# Patient Record
Sex: Female | Born: 1965 | Race: White | Hispanic: No | Marital: Married | Smoking: Former smoker
Health system: Southern US, Community
[De-identification: ages and names within clinical notes are randomized; demographics above are authoritative.]

## PROBLEM LIST (undated history)

## (undated) DIAGNOSIS — Z789 Other specified health status: Secondary | ICD-10-CM

## (undated) HISTORY — PX: WISDOM TOOTH EXTRACTION: SHX21

## (undated) HISTORY — PX: NO PAST SURGERIES: SHX2092

---

## 1998-03-05 ENCOUNTER — Other Ambulatory Visit: Admission: RE | Admit: 1998-03-05 | Discharge: 1998-03-05 | Payer: Self-pay | Admitting: Obstetrics and Gynecology

## 1999-09-12 ENCOUNTER — Other Ambulatory Visit: Admission: RE | Admit: 1999-09-12 | Discharge: 1999-09-12 | Payer: Self-pay | Admitting: *Deleted

## 1999-10-06 ENCOUNTER — Encounter: Admission: RE | Admit: 1999-10-06 | Discharge: 1999-10-06 | Payer: Self-pay | Admitting: General Surgery

## 1999-10-06 ENCOUNTER — Encounter: Payer: Self-pay | Admitting: General Surgery

## 2000-09-24 ENCOUNTER — Encounter (INDEPENDENT_AMBULATORY_CARE_PROVIDER_SITE_OTHER): Payer: Self-pay | Admitting: Specialist

## 2000-09-24 ENCOUNTER — Ambulatory Visit (HOSPITAL_COMMUNITY): Admission: RE | Admit: 2000-09-24 | Discharge: 2000-09-24 | Payer: Self-pay | Admitting: Gastroenterology

## 2001-11-24 ENCOUNTER — Encounter: Admission: RE | Admit: 2001-11-24 | Discharge: 2001-11-24 | Payer: Self-pay | Admitting: Gastroenterology

## 2001-11-24 ENCOUNTER — Encounter: Payer: Self-pay | Admitting: Gastroenterology

## 2002-04-08 ENCOUNTER — Emergency Department (HOSPITAL_COMMUNITY): Admission: EM | Admit: 2002-04-08 | Discharge: 2002-04-08 | Payer: Self-pay | Admitting: *Deleted

## 2005-04-14 ENCOUNTER — Other Ambulatory Visit: Admission: RE | Admit: 2005-04-14 | Discharge: 2005-04-14 | Payer: Self-pay | Admitting: Family Medicine

## 2005-07-07 ENCOUNTER — Encounter: Admission: RE | Admit: 2005-07-07 | Discharge: 2005-07-07 | Payer: Self-pay | Admitting: Family Medicine

## 2007-05-17 ENCOUNTER — Other Ambulatory Visit: Admission: RE | Admit: 2007-05-17 | Discharge: 2007-05-17 | Payer: Self-pay | Admitting: Family Medicine

## 2007-11-11 ENCOUNTER — Encounter: Admission: RE | Admit: 2007-11-11 | Discharge: 2007-11-11 | Payer: Self-pay | Admitting: Family Medicine

## 2008-01-13 ENCOUNTER — Encounter: Admission: RE | Admit: 2008-01-13 | Discharge: 2008-01-13 | Payer: Self-pay | Admitting: Urology

## 2008-12-19 ENCOUNTER — Other Ambulatory Visit: Admission: RE | Admit: 2008-12-19 | Discharge: 2008-12-19 | Payer: Self-pay | Admitting: Family Medicine

## 2009-07-24 ENCOUNTER — Encounter: Admission: RE | Admit: 2009-07-24 | Discharge: 2009-07-24 | Payer: Self-pay | Admitting: Family Medicine

## 2010-10-30 ENCOUNTER — Other Ambulatory Visit: Payer: Self-pay | Admitting: Obstetrics and Gynecology

## 2010-10-30 DIAGNOSIS — Z1231 Encounter for screening mammogram for malignant neoplasm of breast: Secondary | ICD-10-CM

## 2010-11-07 ENCOUNTER — Ambulatory Visit
Admission: RE | Admit: 2010-11-07 | Discharge: 2010-11-07 | Disposition: A | Discharge status: Home / Self Care | Payer: BC Managed Care – PPO | Source: Ambulatory Visit | Attending: Obstetrics and Gynecology | Admitting: Obstetrics and Gynecology

## 2010-11-07 DIAGNOSIS — Z1231 Encounter for screening mammogram for malignant neoplasm of breast: Secondary | ICD-10-CM

## 2010-12-26 NOTE — Procedures (Signed)
[place] [place]  Patient:    Veronica Olson, Veronica Olson                   MRN: [id number] Attending:  Anselmo Rod, M.D. CC:         Sheppard Plumber. Earlene Plater, M.D., [facility]             Debbora Dus, Nurse Practitioner, [facility]                           Procedure Report  DATE OF BIRTH:  Nov 04, 1965.  PROCEDURE:  Colonoscopy with biopsies.  ENDOSCOPIST:  Anselmo Rod, M.D.  INSTRUMENT USED:  Olympus video colonoscope.  INDICATION FOR PROCEDURE:  Rectal bleeding with abdominal cramping, especially in the right lower quadrant, in a 45 year old white female.  Rule out colitis, juvenile polyps, IBD, etc.  PREPROCEDURE PREPARATION:  Informed consent was procured from the patient. The patient was fasted for eight hours prior to the procedure and prepped with a bottle of magnesium citrate and a gallon of NuLytely the night prior to the procedure.  PREPROCEDURE PHYSICAL:  VITAL SIGNS:  The patient had stable vital signs.  NECK:  Supple.  CHEST:  Clear to auscultation.  S1, S2 regular.  No murmur, rub, or gallop, rales, rhonchi, or wheezing.  ABDOMEN:  Soft with normal abdominal bowel sounds.  No hepatosplenomegaly.  No masses palpable.  There is some right lower quadrant tenderness on palpation with guarding.  DESCRIPTION OF PROCEDURE:  The patient was placed in the left lateral decubitus position and sedated with 60 mg of Demerol and 6 mg of Versed intravenously.  Once the patient was adequately sedate and maintained on low-flow oxygen and continuous cardiac monitoring, the Olympus video colonoscope was advanced from the rectum to the cecum and terminal ileum without difficulty.  The entire colonic mucosa appeared healthy without evidence of erosions, ulcerations, masses, or polyps.  There was no evidence of colitis.  The colonic mucosa had a health vascular pattern.  Small internal and external hemorrhoids were appreciated on  retroflexion and anal inspection, respectively.  The patient tolerated the procedure well.  There was minimal erythema in a small segment of the terminal ileum.  This was biopsied to rule out IBD.  The patient tolerated the procedure well without complications.  IMPRESSION: 1. Normal colonoscopy except for small internal and external hemorrhoid. 2. Small patch of erythema in terminal ileum, biopsied for pathology.  No    evidence of ulcerations in the terminal ileum.  RECOMMENDATIONS: 1. Await pathology results. 2. Increase the fluid and fiber in the diet. 3. Gynecological evaluation with patients nurse practitioner, Debbora Dus    [facility]) to rule out gynecological cause of her abdominal pain. 4. Outpatient follow-up on a p.r.n. basis. DD:  09/24/00 TD:  09/25/00 Job: [id number]

## 2012-02-22 ENCOUNTER — Other Ambulatory Visit: Payer: Self-pay | Admitting: Family Medicine

## 2012-02-22 DIAGNOSIS — Z1231 Encounter for screening mammogram for malignant neoplasm of breast: Secondary | ICD-10-CM

## 2012-02-23 ENCOUNTER — Ambulatory Visit
Admission: RE | Admit: 2012-02-23 | Discharge: 2012-02-23 | Disposition: A | Discharge status: Home / Self Care | Payer: BC Managed Care – PPO | Source: Ambulatory Visit | Attending: Family Medicine | Admitting: Family Medicine

## 2012-02-23 DIAGNOSIS — Z1231 Encounter for screening mammogram for malignant neoplasm of breast: Secondary | ICD-10-CM

## 2012-09-15 ENCOUNTER — Other Ambulatory Visit: Payer: Self-pay | Admitting: Gastroenterology

## 2012-09-15 DIAGNOSIS — D1800 Hemangioma unspecified site: Secondary | ICD-10-CM

## 2012-09-20 ENCOUNTER — Ambulatory Visit
Admission: RE | Admit: 2012-09-20 | Discharge: 2012-09-20 | Disposition: A | Discharge status: Home / Self Care | Payer: BC Managed Care – PPO | Source: Ambulatory Visit | Attending: Gastroenterology | Admitting: Gastroenterology

## 2012-09-20 DIAGNOSIS — D1800 Hemangioma unspecified site: Secondary | ICD-10-CM

## 2012-09-20 MED ORDER — GADOBENATE DIMEGLUMINE 529 MG/ML IV SOLN
20.0000 mL | Freq: Once | INTRAVENOUS | Status: AC | PRN
  Administered 2012-09-20: 20 mL via INTRAVENOUS

## 2012-10-10 ENCOUNTER — Telehealth: Payer: Self-pay | Admitting: Genetic Counselor

## 2012-10-10 NOTE — Telephone Encounter (Signed)
S/W pt in re Genetic Appt on 05/05 @ 1 w/Karen Lowell Guitar.  Welcome packet mailed.

## 2012-11-07 ENCOUNTER — Other Ambulatory Visit: Payer: Self-pay | Admitting: Obstetrics & Gynecology

## 2012-11-07 ENCOUNTER — Telehealth: Payer: Self-pay | Admitting: Genetic Counselor

## 2012-11-07 NOTE — Telephone Encounter (Signed)
Pt lvm stated to cancel appt for genetic testing.

## 2012-11-30 ENCOUNTER — Encounter (HOSPITAL_COMMUNITY): Payer: Self-pay

## 2012-11-30 ENCOUNTER — Encounter (HOSPITAL_COMMUNITY)
Admission: RE | Admit: 2012-11-30 | Discharge: 2012-11-30 | Disposition: A | Discharge status: Home / Self Care | Payer: BC Managed Care – PPO | Source: Ambulatory Visit | Attending: Obstetrics & Gynecology | Admitting: Obstetrics & Gynecology

## 2012-11-30 DIAGNOSIS — Z01818 Encounter for other preprocedural examination: Secondary | ICD-10-CM | POA: Insufficient documentation

## 2012-11-30 DIAGNOSIS — Z01812 Encounter for preprocedural laboratory examination: Secondary | ICD-10-CM | POA: Insufficient documentation

## 2012-11-30 HISTORY — DX: Other specified health status: Z78.9

## 2012-11-30 LAB — CBC
Hemoglobin: 12.2 g/dL (ref 12.0–15.0)
MCH: 26.2 pg (ref 26.0–34.0)
RBC: 4.65 MIL/uL (ref 3.87–5.11)
WBC: 9.4 10*3/uL (ref 4.0–10.5)

## 2012-11-30 LAB — SURGICAL PCR SCREEN
MRSA, PCR: NEGATIVE
Staphylococcus aureus: NEGATIVE

## 2012-11-30 NOTE — Patient Instructions (Addendum)
20 Veronica Olson  11/30/2012   Your procedure is scheduled on:  12/22/12  Enter through the Main Entrance of [facility] at 6 AM.  Pick up the phone at the desk and dial [phone].   Call this number if you have problems the morning of surgery: [phone]   Remember:   Do not eat food:After Midnight.  Do not drink clear liquids: After Midnight.  Take these medicines the morning of surgery with A SIP OF WATER: NA   Do not wear jewelry, make-up or nail polish.  Do not wear lotions, powders, or perfumes. You may wear deodorant.  Do not shave 48 hours prior to surgery.  Do not bring valuables to the hospital.  Contacts, dentures or bridgework may not be worn into surgery.  Leave suitcase in the car. After surgery it may be brought to your room.  For patients admitted to the hospital, checkout time is 11:00 AM the day of discharge.   Patients discharged the day of surgery will not be allowed to drive home.  Name and phone number of your driver: NA  Special Instructions: Shower using CHG 2 nights before surgery and the night before surgery.  If you shower the day of surgery use CHG.  Use special wash - you have one bottle of CHG for all showers.  You should use approximately 1/3 of the bottle for each shower.   Please read over the following fact sheets that you were given: MRSA Information

## 2012-12-05 MED ORDER — HYDROMORPHONE HCL PF 1 MG/ML IJ SOLN
INTRAMUSCULAR | Status: AC
  Filled 2012-12-05: qty 1

## 2012-12-09 ENCOUNTER — Encounter (HOSPITAL_COMMUNITY): Payer: Self-pay | Admitting: Pharmacist

## 2012-12-12 ENCOUNTER — Other Ambulatory Visit: Payer: BC Managed Care – PPO | Admitting: Lab

## 2012-12-12 ENCOUNTER — Encounter: Payer: BC Managed Care – PPO | Admitting: Genetic Counselor

## 2012-12-21 NOTE — H&P (Signed)
Veronica Olson is an 47 y.o. female c/o years of menometrorrhagia.  Recent ultrasound shows fibroid uterus with largest measuring 4 cm.  She had a normal endometrial biopsy.  She desires definitive management.  Pertinent Gynecological History: Menses: irregular occurring approximately every 14-28 days with spotting approximately no days per month Bleeding: dysfunctional uterine bleeding Contraception: none DES exposure: unknown Blood transfusions: none Sexually transmitted diseases: no past history Previous GYN Procedures: none  Last mammogram: normal Date: 2012 Last pap: normal Date: 09/2012 OB History: G0  Menstrual History: Menarche age: n/a No LMP recorded.    Past Medical History  Diagnosis Date  . Medical history non-contributory     Past Surgical History  Procedure Laterality Date  . No past surgeries      No family history on file.  Social History:  reports that she has quit smoking. She does not have any smokeless tobacco history on file. She reports that  drinks alcohol. She reports that she does not use illicit drugs.  Allergies:  Allergies  Allergen Reactions  . Other Hives    "purple dye" - hives to wellbutrin when it was purple; can take wellbutrin without problems without purple dye.  . Latex Rash    Latex gloves    No prescriptions prior to admission    ROS  There were no vitals taken for this visit. Physical Exam  Constitutional: She is oriented to person, place, and time. She appears well-developed and well-nourished.  Cardiovascular: Normal rate and regular rhythm.   Respiratory: Effort normal and breath sounds normal.  GI: Soft. There is no rebound and no guarding.  Neurological: She is alert and oriented to person, place, and time.  Skin: Skin is warm and dry.  Psychiatric: She has a normal mood and affect. Her behavior is normal.    No results found for this or any previous visit (from the past 24 hour(s)).  No results  found.  Assessment/Plan: 46yo with menometrorrhagia and fibroid uterus desiring definitive management -[facility], cysto  Damein Gaunce 12/21/2012, 5:29 PM

## 2012-12-22 ENCOUNTER — Ambulatory Visit (HOSPITAL_COMMUNITY): Payer: BC Managed Care – PPO | Admitting: Anesthesiology

## 2012-12-22 ENCOUNTER — Observation Stay (HOSPITAL_COMMUNITY)
Admission: RE | Admit: 2012-12-22 | Discharge: 2012-12-23 | Disposition: A | Discharge status: Home / Self Care | Payer: BC Managed Care – PPO | Source: Ambulatory Visit | Attending: Obstetrics & Gynecology | Admitting: Obstetrics & Gynecology

## 2012-12-22 ENCOUNTER — Encounter (HOSPITAL_COMMUNITY)
Admission: RE | Disposition: A | Discharge status: Home / Self Care | Payer: Self-pay | Source: Ambulatory Visit | Attending: Obstetrics & Gynecology

## 2012-12-22 ENCOUNTER — Ambulatory Visit (HOSPITAL_COMMUNITY): Payer: BC Managed Care – PPO

## 2012-12-22 ENCOUNTER — Encounter (HOSPITAL_COMMUNITY): Payer: Self-pay | Admitting: Anesthesiology

## 2012-12-22 ENCOUNTER — Encounter (HOSPITAL_COMMUNITY): Payer: Self-pay | Admitting: *Deleted

## 2012-12-22 DIAGNOSIS — N920 Excessive and frequent menstruation with regular cycle: Principal | ICD-10-CM | POA: Insufficient documentation

## 2012-12-22 DIAGNOSIS — Y9210 Unspecified residential institution as the place of occurrence of the external cause: Secondary | ICD-10-CM | POA: Insufficient documentation

## 2012-12-22 DIAGNOSIS — D251 Intramural leiomyoma of uterus: Secondary | ICD-10-CM | POA: Insufficient documentation

## 2012-12-22 DIAGNOSIS — D250 Submucous leiomyoma of uterus: Secondary | ICD-10-CM | POA: Insufficient documentation

## 2012-12-22 DIAGNOSIS — N3289 Other specified disorders of bladder: Secondary | ICD-10-CM | POA: Insufficient documentation

## 2012-12-22 DIAGNOSIS — R310 Gross hematuria: Secondary | ICD-10-CM | POA: Insufficient documentation

## 2012-12-22 DIAGNOSIS — IMO0002 Reserved for concepts with insufficient information to code with codable children: Secondary | ICD-10-CM | POA: Insufficient documentation

## 2012-12-22 HISTORY — PX: LAPAROSCOPIC HYSTERECTOMY: SHX1926

## 2012-12-22 HISTORY — PX: CYSTOSCOPY: SHX5120

## 2012-12-22 HISTORY — PX: LAPAROSCOPY: SHX197

## 2012-12-22 SURGERY — HYSTERECTOMY, TOTAL, LAPAROSCOPIC
Anesthesia: General | Site: Bladder | Wound class: Clean Contaminated

## 2012-12-22 MED ORDER — METOCLOPRAMIDE HCL 5 MG/ML IJ SOLN: 10.0000 mg | Freq: Once | INTRAMUSCULAR | Status: DC | PRN

## 2012-12-22 MED ORDER — HYOSCYAMINE SULFATE 0.125 MG PO TABS
0.1250 mg | ORAL_TABLET | ORAL | Status: DC | PRN
  Filled 2012-12-22: qty 1

## 2012-12-22 MED ORDER — ACETAMINOPHEN 10 MG/ML IV SOLN
INTRAVENOUS | Status: AC
  Administered 2012-12-22: 1000 mg via INTRAVENOUS
  Filled 2012-12-22: qty 100

## 2012-12-22 MED ORDER — ROCURONIUM BROMIDE 50 MG/5ML IV SOLN
INTRAVENOUS | Status: AC
  Filled 2012-12-22: qty 1

## 2012-12-22 MED ORDER — IBUPROFEN 800 MG PO TABS
800.0000 mg | ORAL_TABLET | Freq: Three times a day (TID) | ORAL | Status: DC | PRN
  Administered 2012-12-22 – 2012-12-23 (×2): 800 mg via ORAL
  Filled 2012-12-22 (×2): qty 1

## 2012-12-22 MED ORDER — DEXTROSE IN LACTATED RINGERS 5 % IV SOLN
INTRAVENOUS | Status: DC
  Administered 2012-12-22: 20:00:00 via INTRAVENOUS

## 2012-12-22 MED ORDER — FENTANYL CITRATE 0.05 MG/ML IJ SOLN
INTRAMUSCULAR | Status: AC
  Filled 2012-12-22: qty 5

## 2012-12-22 MED ORDER — DOCUSATE SODIUM 100 MG PO CAPS
100.0000 mg | ORAL_CAPSULE | Freq: Two times a day (BID) | ORAL | Status: DC
  Administered 2012-12-22: 100 mg via ORAL
  Filled 2012-12-22: qty 1

## 2012-12-22 MED ORDER — MENTHOL 3 MG MT LOZG: 1.0000 | LOZENGE | OROMUCOSAL | Status: DC | PRN

## 2012-12-22 MED ORDER — LIDOCAINE HCL (CARDIAC) 20 MG/ML IV SOLN
INTRAVENOUS | Status: AC
  Filled 2012-12-22: qty 5

## 2012-12-22 MED ORDER — EPHEDRINE 5 MG/ML INJ
INTRAVENOUS | Status: AC
  Filled 2012-12-22: qty 10

## 2012-12-22 MED ORDER — LACTATED RINGERS IV SOLN
INTRAVENOUS | Status: DC
  Administered 2012-12-22 (×5): via INTRAVENOUS

## 2012-12-22 MED ORDER — KETOROLAC TROMETHAMINE 30 MG/ML IJ SOLN
INTRAMUSCULAR | Status: AC
  Filled 2012-12-22: qty 1

## 2012-12-22 MED ORDER — OXYBUTYNIN CHLORIDE ER 10 MG PO TB24
10.0000 mg | ORAL_TABLET | Freq: Every day | ORAL | Status: DC
  Filled 2012-12-22 (×2): qty 1

## 2012-12-22 MED ORDER — INDIGOTINDISULFONATE SODIUM 8 MG/ML IJ SOLN
INTRAMUSCULAR | Status: DC | PRN
  Administered 2012-12-22: 5 mL via INTRAVENOUS

## 2012-12-22 MED ORDER — STERILE WATER FOR IRRIGATION IR SOLN
Status: DC | PRN
  Administered 2012-12-22: 1000 mL via INTRAVESICAL

## 2012-12-22 MED ORDER — CEFAZOLIN SODIUM-DEXTROSE 2-3 GM-% IV SOLR
INTRAVENOUS | Status: AC
  Filled 2012-12-22: qty 50

## 2012-12-22 MED ORDER — PROPOFOL 10 MG/ML IV BOLUS
INTRAVENOUS | Status: DC | PRN
  Administered 2012-12-22: 200 mg via INTRAVENOUS
  Administered 2012-12-22: 230 mg via INTRAVENOUS

## 2012-12-22 MED ORDER — KETOROLAC TROMETHAMINE 30 MG/ML IJ SOLN: 15.0000 mg | Freq: Once | INTRAMUSCULAR | Status: DC | PRN

## 2012-12-22 MED ORDER — PROPOFOL 10 MG/ML IV EMUL
INTRAVENOUS | Status: AC
  Filled 2012-12-22: qty 20

## 2012-12-22 MED ORDER — GLYCOPYRROLATE 0.2 MG/ML IJ SOLN
INTRAMUSCULAR | Status: AC
  Filled 2012-12-22: qty 3

## 2012-12-22 MED ORDER — GLYCOPYRROLATE 0.2 MG/ML IJ SOLN
INTRAMUSCULAR | Status: DC | PRN
  Administered 2012-12-22: 0.6 mg via INTRAVENOUS
  Administered 2012-12-22: 0.4 mg via INTRAVENOUS
  Administered 2012-12-22: .4 mg via INTRAVENOUS

## 2012-12-22 MED ORDER — CEPHALEXIN 500 MG PO CAPS
500.0000 mg | ORAL_CAPSULE | Freq: Two times a day (BID) | ORAL | Status: DC
  Administered 2012-12-22: 500 mg via ORAL
  Filled 2012-12-22 (×2): qty 1

## 2012-12-22 MED ORDER — HYDROMORPHONE HCL PF 1 MG/ML IJ SOLN
0.2500 mg | INTRAMUSCULAR | Status: DC | PRN
  Administered 2012-12-22: 0.5 mg via INTRAVENOUS

## 2012-12-22 MED ORDER — FENTANYL CITRATE 0.05 MG/ML IJ SOLN
INTRAMUSCULAR | Status: AC
  Filled 2012-12-22: qty 2

## 2012-12-22 MED ORDER — INDIGOTINDISULFONATE SODIUM 8 MG/ML IJ SOLN
INTRAMUSCULAR | Status: AC
  Filled 2012-12-22: qty 5

## 2012-12-22 MED ORDER — PHENYLEPHRINE HCL 10 MG/ML IJ SOLN
INTRAMUSCULAR | Status: DC | PRN
  Administered 2012-12-22: 80 ug via INTRAVENOUS
  Administered 2012-12-22 (×3): 40 ug via INTRAVENOUS
  Administered 2012-12-22: 80 ug via INTRAVENOUS

## 2012-12-22 MED ORDER — BUPIVACAINE HCL (PF) 0.25 % IJ SOLN
INTRAMUSCULAR | Status: DC | PRN
  Administered 2012-12-22: 8 mL

## 2012-12-22 MED ORDER — STERILE WATER FOR IRRIGATION IR SOLN
Status: DC | PRN
  Administered 2012-12-22: 3000 mL

## 2012-12-22 MED ORDER — MIDAZOLAM HCL 5 MG/5ML IJ SOLN
INTRAMUSCULAR | Status: DC | PRN
  Administered 2012-12-22: 2 mg via INTRAVENOUS

## 2012-12-22 MED ORDER — KETOROLAC TROMETHAMINE 30 MG/ML IJ SOLN
INTRAMUSCULAR | Status: DC | PRN
  Administered 2012-12-22: 30 mg via INTRAMUSCULAR

## 2012-12-22 MED ORDER — HYDROMORPHONE HCL PF 1 MG/ML IJ SOLN: 0.2000 mg | INTRAMUSCULAR | Status: DC | PRN

## 2012-12-22 MED ORDER — ONDANSETRON HCL 4 MG/2ML IJ SOLN
INTRAMUSCULAR | Status: AC
  Filled 2012-12-22: qty 2

## 2012-12-22 MED ORDER — DEXTROSE IN LACTATED RINGERS 5 % IV SOLN: INTRAVENOUS | Status: DC

## 2012-12-22 MED ORDER — HYDROMORPHONE HCL PF 1 MG/ML IJ SOLN
INTRAMUSCULAR | Status: AC
  Administered 2012-12-22: 0.5 mg via INTRAVENOUS
  Filled 2012-12-22: qty 1

## 2012-12-22 MED ORDER — LACTATED RINGERS IR SOLN
Status: DC | PRN
  Administered 2012-12-22: 3000 mL

## 2012-12-22 MED ORDER — CEFAZOLIN SODIUM-DEXTROSE 2-3 GM-% IV SOLR
2.0000 g | INTRAVENOUS | Status: AC
  Administered 2012-12-22 (×2): 2 g via INTRAVENOUS

## 2012-12-22 MED ORDER — PHENYLEPHRINE 40 MCG/ML (10ML) SYRINGE FOR IV PUSH (FOR BLOOD PRESSURE SUPPORT)
PREFILLED_SYRINGE | INTRAVENOUS | Status: AC
  Filled 2012-12-22: qty 5

## 2012-12-22 MED ORDER — METHYLENE BLUE 1 % INJ SOLN
INTRAMUSCULAR | Status: AC
  Filled 2012-12-22: qty 1

## 2012-12-22 MED ORDER — EPHEDRINE SULFATE 50 MG/ML IJ SOLN
INTRAMUSCULAR | Status: DC | PRN
  Administered 2012-12-22 (×2): 10 mg via INTRAVENOUS

## 2012-12-22 MED ORDER — ONDANSETRON HCL 4 MG/2ML IJ SOLN
4.0000 mg | Freq: Four times a day (QID) | INTRAMUSCULAR | Status: DC | PRN
  Filled 2012-12-22: qty 2

## 2012-12-22 MED ORDER — DEXAMETHASONE SODIUM PHOSPHATE 4 MG/ML IJ SOLN
INTRAMUSCULAR | Status: DC | PRN
  Administered 2012-12-22: 10 mg via INTRAVENOUS

## 2012-12-22 MED ORDER — ONDANSETRON HCL 4 MG/2ML IJ SOLN
INTRAMUSCULAR | Status: DC | PRN
  Administered 2012-12-22: 4 mg via INTRAVENOUS

## 2012-12-22 MED ORDER — STERILE WATER FOR IRRIGATION IR SOLN
Status: DC | PRN
  Administered 2012-12-22: 3000 mL via INTRAVESICAL

## 2012-12-22 MED ORDER — PROPOFOL 10 MG/ML IV EMUL
INTRAVENOUS | Status: AC
  Filled 2012-12-22: qty 40

## 2012-12-22 MED ORDER — ALUM & MAG HYDROXIDE-SIMETH 200-200-20 MG/5ML PO SUSP: 30.0000 mL | ORAL | Status: DC | PRN

## 2012-12-22 MED ORDER — NEOSTIGMINE METHYLSULFATE 1 MG/ML IJ SOLN
INTRAMUSCULAR | Status: AC
  Filled 2012-12-22: qty 1

## 2012-12-22 MED ORDER — METHYLENE BLUE 1 % INJ SOLN
INTRAMUSCULAR | Status: DC | PRN
  Administered 2012-12-22: 1 mL

## 2012-12-22 MED ORDER — ACETAMINOPHEN 10 MG/ML IV SOLN: 1000.0000 mg | Freq: Once | INTRAVENOUS | Status: DC

## 2012-12-22 MED ORDER — PROPOFOL INFUSION 10 MG/ML OPTIME
INTRAVENOUS | Status: DC | PRN
  Administered 2012-12-22: 100 ug/kg/min via INTRAVENOUS

## 2012-12-22 MED ORDER — NEOSTIGMINE METHYLSULFATE 1 MG/ML IJ SOLN
INTRAMUSCULAR | Status: DC | PRN
  Administered 2012-12-22: 2 mg via INTRAVENOUS
  Administered 2012-12-22: 3 mg via INTRAVENOUS
  Administered 2012-12-22: 2 mg via INTRAVENOUS

## 2012-12-22 MED ORDER — DEXAMETHASONE SODIUM PHOSPHATE 10 MG/ML IJ SOLN
INTRAMUSCULAR | Status: AC
  Filled 2012-12-22: qty 1

## 2012-12-22 MED ORDER — MIDAZOLAM HCL 2 MG/2ML IJ SOLN
INTRAMUSCULAR | Status: AC
  Filled 2012-12-22: qty 2

## 2012-12-22 MED ORDER — SODIUM CHLORIDE 0.9 % IJ SOLN
INTRAMUSCULAR | Status: DC | PRN
  Administered 2012-12-22: 7 mL

## 2012-12-22 MED ORDER — BUPIVACAINE HCL (PF) 0.25 % IJ SOLN
INTRAMUSCULAR | Status: AC
  Filled 2012-12-22: qty 30

## 2012-12-22 MED ORDER — FENTANYL CITRATE 0.05 MG/ML IJ SOLN
INTRAMUSCULAR | Status: DC | PRN
  Administered 2012-12-22: 100 ug via INTRAVENOUS
  Administered 2012-12-22 (×2): 50 ug via INTRAVENOUS
  Administered 2012-12-22: 100 ug via INTRAVENOUS
  Administered 2012-12-22 (×3): 50 ug via INTRAVENOUS
  Administered 2012-12-22: 100 ug via INTRAVENOUS
  Administered 2012-12-22: 50 ug via INTRAVENOUS
  Administered 2012-12-22: 100 ug via INTRAVENOUS
  Administered 2012-12-22 (×3): 50 ug via INTRAVENOUS

## 2012-12-22 MED ORDER — ZOLPIDEM TARTRATE 5 MG PO TABS: 5.0000 mg | ORAL_TABLET | Freq: Every evening | ORAL | Status: DC | PRN

## 2012-12-22 MED ORDER — OXYCODONE-ACETAMINOPHEN 5-325 MG PO TABS: 1.0000 | ORAL_TABLET | ORAL | Status: DC | PRN

## 2012-12-22 MED ORDER — SIMETHICONE 80 MG PO CHEW: 80.0000 mg | CHEWABLE_TABLET | Freq: Four times a day (QID) | ORAL | Status: DC | PRN

## 2012-12-22 MED ORDER — ROCURONIUM BROMIDE 100 MG/10ML IV SOLN
INTRAVENOUS | Status: DC | PRN
  Administered 2012-12-22 (×3): 10 mg via INTRAVENOUS
  Administered 2012-12-22: 20 mg via INTRAVENOUS
  Administered 2012-12-22 (×5): 10 mg via INTRAVENOUS
  Administered 2012-12-22: 50 mg via INTRAVENOUS
  Administered 2012-12-22 (×4): 10 mg via INTRAVENOUS
  Administered 2012-12-22: 5 mg via INTRAVENOUS
  Administered 2012-12-22: 45 mg via INTRAVENOUS

## 2012-12-22 MED ORDER — LIDOCAINE HCL (CARDIAC) 20 MG/ML IV SOLN
INTRAVENOUS | Status: DC | PRN
  Administered 2012-12-22: 20 mg via INTRAVENOUS
  Administered 2012-12-22: 40 mg via INTRAVENOUS
  Administered 2012-12-22 (×2): 20 mg via INTRAVENOUS
  Administered 2012-12-22: 80 mg via INTRAVENOUS
  Administered 2012-12-22: 20 mg via INTRAVENOUS

## 2012-12-22 MED ORDER — ONDANSETRON HCL 4 MG PO TABS: 4.0000 mg | ORAL_TABLET | Freq: Four times a day (QID) | ORAL | Status: DC | PRN

## 2012-12-22 SURGICAL SUPPLY — 73 items
APPLIER CLIP 5 13 M/L LIGAMAX5 (MISCELLANEOUS) ×3
BLADE SURG 10 STRL SS (BLADE) ×3 IMPLANT
CABLE HIGH FREQUENCY MONO STRZ (ELECTRODE) ×6 IMPLANT
CANISTER SUCTION 2500CC (MISCELLANEOUS) ×15 IMPLANT
CATH SILICONE 16FRX5CC (CATHETERS) ×9 IMPLANT
CHLORAPREP W/TINT 26ML (MISCELLANEOUS) ×3 IMPLANT
CLEANER TIP ELECTROSURG 2X2 (MISCELLANEOUS) ×3 IMPLANT
CLIP APPLIE 5 13 M/L LIGAMAX5 (MISCELLANEOUS) ×2 IMPLANT
CLOTH BEACON ORANGE TIMEOUT ST (SAFETY) ×3 IMPLANT
COUNTER NEEDLE 1200 MAGNETIC (NEEDLE) ×3 IMPLANT
COVER LIGHT HANDLE  1/PK (MISCELLANEOUS) ×2
COVER LIGHT HANDLE 1/PK (MISCELLANEOUS) ×4 IMPLANT
COVER TABLE BACK 60X90 (DRAPES) ×12 IMPLANT
DECANTER SPIKE VIAL GLASS SM (MISCELLANEOUS) ×3 IMPLANT
DERMABOND ADVANCED (GAUZE/BANDAGES/DRESSINGS) ×5
DERMABOND ADVANCED .7 DNX12 (GAUZE/BANDAGES/DRESSINGS) ×10 IMPLANT
DISSECTOR BLUNT TIP ENDO 5MM (MISCELLANEOUS) ×9 IMPLANT
DRAPE C-ARM 42X72 X-RAY (DRAPES) ×3 IMPLANT
DRAPE PROXIMA HALF (DRAPES) ×15 IMPLANT
ELECT REM PT RETURN 9FT ADLT (ELECTROSURGICAL) ×6
ELECTRODE REM PT RTRN 9FT ADLT (ELECTROSURGICAL) ×4 IMPLANT
EVACUATOR SMOKE 8.L (FILTER) ×3 IMPLANT
GAUZE SPONGE 4X4 12PLY STRL LF (GAUZE/BANDAGES/DRESSINGS) ×12 IMPLANT
GAUZE SPONGE 4X4 16PLY XRAY LF (GAUZE/BANDAGES/DRESSINGS) ×24 IMPLANT
GLOVE BIO SURGEON STRL SZ 6 (GLOVE) ×3 IMPLANT
GLOVE BIOGEL PI IND STRL 6 (GLOVE) ×8 IMPLANT
GLOVE BIOGEL PI IND STRL 7.0 (GLOVE) ×74 IMPLANT
GLOVE BIOGEL PI INDICATOR 6 (GLOVE) ×4
GLOVE BIOGEL PI INDICATOR 7.0 (GLOVE) ×37
GLOVE NEODERM STER SZ 7 (GLOVE) ×54 IMPLANT
GLOVE SURG SS PI 6.5 STRL IVOR (GLOVE) ×21 IMPLANT
GLOVE SURG SS PI 7.0 STRL IVOR (GLOVE) ×132 IMPLANT
GOWN PREVENTION PLUS LG XLONG (DISPOSABLE) ×6 IMPLANT
GOWN STRL REIN XL XLG (GOWN DISPOSABLE) ×84 IMPLANT
LEGGING LITHOTOMY PAIR STRL (DRAPES) ×6 IMPLANT
NEEDLE INSUFFLATION 120MM (ENDOMECHANICALS) ×3 IMPLANT
NS IRRIG 1000ML POUR BTL (IV SOLUTION) ×3 IMPLANT
PACK LAPAROSCOPY BASIN (CUSTOM PROCEDURE TRAY) ×3 IMPLANT
PACK LAVH (CUSTOM PROCEDURE TRAY) ×3 IMPLANT
PAD OB MATERNITY 4.3X12.25 (PERSONAL CARE ITEMS) ×3 IMPLANT
PENCIL BUTTON HOLSTER BLD 10FT (ELECTRODE) ×6 IMPLANT
PROTECTOR NERVE ULNAR (MISCELLANEOUS) ×6 IMPLANT
SCALPEL HARMONIC ACE (MISCELLANEOUS) ×3 IMPLANT
SET CYSTO W/LG BORE CLAMP LF (SET/KITS/TRAYS/PACK) ×15 IMPLANT
SET IRRIG TUBING LAPAROSCOPIC (IRRIGATION / IRRIGATOR) ×6 IMPLANT
SPONGE LAP 4X18 X RAY DECT (DISPOSABLE) ×3 IMPLANT
SUT MNCRL 0 MO-4 VIOLET 18 CR (SUTURE) ×6 IMPLANT
SUT MNCRL AB 3-0 PS2 27 (SUTURE) ×15 IMPLANT
SUT MON AB 2-0 CT1 36 (SUTURE) ×3 IMPLANT
SUT MON AB 3-0 SH 27 (SUTURE) ×1
SUT MON AB 3-0 SH27 (SUTURE) ×2 IMPLANT
SUT MON AB-0 CT1 36 (SUTURE) ×3 IMPLANT
SUT MONOCRYL 0 MO 4 18  CR/8 (SUTURE) ×3
SUT VIC AB 2-0 UR6 27 (SUTURE) ×3 IMPLANT
SUT VIC AB 3-0 SH 27 (SUTURE) ×8
SUT VIC AB 3-0 SH 27X BRD (SUTURE) ×8 IMPLANT
SUT VIC AB 3-0 SH 27XBRD (SUTURE) ×8 IMPLANT
SUT VICRYL 0 UR6 27IN ABS (SUTURE) ×9 IMPLANT
SYR 50ML LL SCALE MARK (SYRINGE) ×6 IMPLANT
SYR BULB IRRIGATION 50ML (SYRINGE) ×6 IMPLANT
SYR CONTROL 10ML LL (SYRINGE) ×12 IMPLANT
SYRINGE 10CC LL (SYRINGE) ×6 IMPLANT
TIP UTERINE 6.7X8CM BLUE DISP (MISCELLANEOUS) ×3 IMPLANT
TOWEL OR 17X24 6PK STRL BLUE (TOWEL DISPOSABLE) ×9 IMPLANT
TRAY FOLEY BAG SILVER LF 14FR (CATHETERS) ×3 IMPLANT
TRAY FOLEY CATH 16FR SILVER (SET/KITS/TRAYS/PACK) ×3 IMPLANT
TROCAR XCEL NON-BLD 11X100MML (ENDOMECHANICALS) ×9 IMPLANT
TROCAR XCEL NON-BLD 5MMX100MML (ENDOMECHANICALS) ×15 IMPLANT
TUBING SUCTION BULK 100 FT (MISCELLANEOUS) ×3 IMPLANT
VLOCL0604 ×3 IMPLANT
VLOCL2105 ×3 IMPLANT
WARMER LAPAROSCOPE (MISCELLANEOUS) ×3 IMPLANT
YANKAUER SUCT BULB TIP NO VENT (SUCTIONS) ×3 IMPLANT

## 2012-12-22 NOTE — Anesthesia Preprocedure Evaluation (Signed)
Anesthesia Evaluation  Patient identified by MRN, date of birth, ID band Patient awake    Reviewed: Allergy & Precautions, H&P , NPO status , Patient's Chart, lab work & pertinent test results, reviewed documented beta blocker date and time   History of Anesthesia Complications Negative for: history of anesthetic complications  Airway Mallampati: II TM Distance: >3 FB Neck ROM: full    Dental  (+) Teeth Intact   Pulmonary neg pulmonary ROS,  breath sounds clear to auscultation  Pulmonary exam normal       Cardiovascular Exercise Tolerance: Good negative cardio ROS  Rhythm:regular Rate:Normal     Neuro/Psych negative neurological ROS  negative psych ROS   GI/Hepatic negative GI ROS, Neg liver ROS,   Endo/Other  BMI - 34.7  Renal/GU negative Renal ROS  Female GU complaint     Musculoskeletal   Abdominal   Peds  Hematology negative hematology ROS (+)   Anesthesia Other Findings   Reproductive/Obstetrics negative OB ROS                           Anesthesia Physical Anesthesia Plan  ASA: II  Anesthesia Plan: General ETT   Post-op Pain Management:    Induction:   Airway Management Planned:   Additional Equipment:   Intra-op Plan:   Post-operative Plan:   Informed Consent: I have reviewed the patients History and Physical, chart, labs and discussed the procedure including the risks, benefits and alternatives for the proposed anesthesia with the patient or authorized representative who has indicated his/her understanding and acceptance.   Dental Advisory Given  Plan Discussed with: CRNA and Surgeon  Anesthesia Plan Comments:         Anesthesia Quick Evaluation

## 2012-12-22 NOTE — Transfer of Care (Signed)
Immediate Anesthesia Transfer of Care Note  Patient: Veronica Olson  Procedure(s) Performed: Procedure(s) with comments: HYSTERECTOMY TOTAL LAPAROSCOPIC (N/A) CYSTOSCOPY (N/A) CYSTOSCOPY (N/A) CYSTOSCOPY (N/A) - woodruff  repeated cysto prior to closure LAPAROSCOPY DIAGNOSTIC with bladder repair (N/A)  Patient Location: PACU  Anesthesia Type:General  Level of Consciousness: awake  Airway & Oxygen Therapy: Patient Spontanous Breathing  Post-op Assessment: Report given to PACU RN  Post vital signs: stable  Filed Vitals:   12/22/12 0625  BP: 143/89  Pulse: 83  Temp: 36.8 C  Resp: 18    Complications: No apparent anesthesia complications

## 2012-12-22 NOTE — OR Nursing (Signed)
Pt has noted red tinged drainage from catheter dr Margarita Grizzle notified stated it was expected

## 2012-12-22 NOTE — OR Nursing (Signed)
Cysto added to procedure during timeout at 1304.

## 2012-12-22 NOTE — OR Nursing (Signed)
At 1014 Dr. Britt Boozer anesthesiologist called to room for [facility] CRNA to explain pt situation to him.

## 2012-12-22 NOTE — Transfer of Care (Signed)
Immediate Anesthesia Transfer of Care Note  Patient: Veronica Olson  Procedure(s) Performed: Procedure(s): HYSTERECTOMY TOTAL LAPAROSCOPIC (N/A) CYSTOSCOPY (N/A)  Patient Location: PACU  Anesthesia Type:General  Level of Consciousness: awake, alert , oriented and patient cooperative  Airway & Oxygen Therapy: Patient Spontanous Breathing and Patient connected to nasal cannula oxygen  Post-op Assessment: Report given to PACU RN and Post -op Vital signs reviewed and stable  Post vital signs: Reviewed and stable  Complications: No apparent anesthesia complications

## 2012-12-22 NOTE — Progress Notes (Signed)
No change to H&P. 

## 2012-12-22 NOTE — Op Note (Addendum)
NAME: Veronica Olson DATE OF PROCEDURE: 12/22/12 PREOPERATIVE DIAGNOSIS: Menometrorrhagia and fibroid uterus POSTOPERATIVE DIAGNOSIS: SAA OPERATIVE PROCEDURE: Total laparoscopic hysterectomy, cystoscopy SURGEON: Dr. Mitchel Honour ASSISTANTS: Dr. Richarda Overlie ANESTHESIA: General Endotracheal ESTIMATED BLOOD LOSS: 200cc COMPLICATIONS: None apparent PATHOLOGY: Uterus and cervix FINDINGS: Enlarged fibroid uterus, normal tubes and ovaries bilaterally.  Normal upper abdomen. DESCRIPTION OF PROCEDURE: The patient was taken to the operating room where general anesthesia was induced without difficulty. She was then placed in the dorsal lithotomy position with careful attention in positioning to avoid over extention, flexion, AB-duction or AD-duction of the lower extremities. She was prepped in a sterile fashion and after a timeout was performed, a RUMI manipulator was placed into the os after dilation to 25 [place]. The internal and external balloons were inflated. Next, gloves were changed and attention was turned to the patient's abdomen.incision was made in umbilicus after infiltration with marcaine and a Veres needle was inserted into the abdomen. Position confirmed by saline drop test. Next, pneumoperitoneum was created with carbon dioxide. The Veres needle was removed and a 10mm trocar was placed in the umbilical incision followed by confirmation of proper placement by direct visualization with the camera. A survey of the abdomen showed no bleeding from the insertion sites, no injury to the structures below the insertion sites. Two additional 5mm ports were placed in a similar fashion approximately 3 cm superior to and 3 cm medial to the anterior superior iliac spine bilaterally. The ureters were identified bilaterally and the pelvis was surveyed and the findings were noted as above.  Harmonic scalpel was used to grasp the right fallopian tube where it was coapted and cut. The harmonic scalpel was then  used to take down the broad ligament down to the level of the cervix and bladder flap was created with the harmonic scalpel and uterine arteries were skeletonized. The uterine arteries were then cut after coapting the tissue thoroughly. All of these pedicles were hemostatic. Next, attention was turned to the contralateral side which was dissected in a similar fashion and bladder flap created to join the other side. Blunt dissection was used to help dissect off the bladder past the internal os where the metal cup of the RUMI could be felt. Using the harmonic scalpel active blade, the vagina was cut circumferentially above the uterosacral ligaments using the metal cup of the RUMI as a guide. The uterus was pulled down through the cuff and send to pathology.  Attention was then turned to the vaginal cuff closure. The cuff was closed with 3-0 Monocryl using 5 figure-of-eight stitches.  A right labia minor laceration was sustained during the removal of the specimen and retraction.  This was repaired with 3-0 Vicryl in a running fashion. Next, the cystoscope was advanced after having given the patient IV methylene blue. No defects were noted with the bladder and there was bilateral ureteral jets noted.   Attention was returned to the abdomen were a second look was taken.  Suction irrigator was used to thoroughly irrigate the pelvis and pedicles were inspected and found to be hemostatic. The insufflation was let down to less than and the pedicles were once again visualized and found to be hemostatic. Next, instruments and trocars were removed under direct visualization.  The umbilical fascial incision was closed with a single interrupted 3-0 Vicryl stitch.  The skin incisions were closed with 2-0 Monocryl and then reapproximated with Dermabond. Sponge, lap and needle counts were correct times two.

## 2012-12-22 NOTE — Op Note (Signed)
Urology Operative Report  Date of Procedure: 12/22/12  Surgeon: Natalia Leatherwood, MD Assistant: Dr. Langston Masker  Preoperative Diagnosis: Cystotomy Postoperative Diagnosis:  Same  Procedure(s): Exploratory laparoscopy Laparoscopic cystorrhaphy Laparoscopic omental flap placement. Cystoscopy  Estimated blood loss: <50cc  Specimen: None  Drains: Foley catheter  Complications: None  Findings: Posterior cystotomy. Bilateral effluxing ureters.  History of present illness: 47 year old female presented today for arthroscopic hysterectomy. During the hysterectomy Dr. Lattie Corns and noted gross hematuria and she called my colleague, Dr. Brunilda Payor, the on-call urologist regarding this finding. Dr. Harriett Sine performed a cystoscopy and bilateral retrograde pyelograms. You can refer to his operative note for the findings. He discovered a posterior low cystotomy. It was preferred that this be repaired laparoscopically and he called me to see if I would be able to perform this procedure.   Procedure in detail: I entered the operating room and the patient was under general anesthetic. Dr. Harriett Sine had completed his cystoscopy. He was able to show me on the screen where the cystotomy was located. It was posterior and medial to the right ureter.  We then elected to proceed with laparoscopic repair after Dr. Langston Masker had the opportunity to speak to the patient's partner. The patient's abdomen was prepped and draped in the usual sterile fashion. She was already in a dorsal lithotomy position. Her genitals were prepped and draped as well. A sponge stick was gently placed into her vagina. We then dissected through the previous microscopic ports which are to close. This include a 12 mm umbilical port as well as a 5 mm port in the right lower quadrant and a 5 mm port in the left lower quadrant. After the 12 mm port was placed we were able to visually placed the 5 mm ports positions mentioned. The patient was then placed in  Trendelenburg position. Her able to identify the right ureter which was the ureter closest to the injury. This was dissected down to the bladder hiatus. We then instilled sterile fluid colored with indigo carmine. A cystotomy was visible on the posterior midline. This was closed with a running 30 V- lock suture. Care was taken to avoid the right ureter. After this was complete the bladder was tested with over 200 cc of sterile fluid diet with indigo carmine. The repair was found to be watertight. Because of the location of the cystotomy and the closure of her vaginal cuff. We elected to place the omentum between the areas to prevent future fistula. This was accomplished by placing the patient back in a level position and the omentum was brought down with ease. This was sutured into place using 3-0 Vicryl sutures. After this was complete I performed a cystoscopy in which I was able to visualize the area of cystotomy which was now closed. I was able to see clear reflux from the ureters bilaterally. A Foley catheter was placed back in the bladder and inflated with 10 cc of water.  And additional 12 mm port had been placed in the right upper quadrant under direct visualization to allow passage of sutures. This was closed with a laparoscopic device with 0 Vicryl. The 5 mm ports and all the other ports were removed and there was no port site bleeding. The fascia of the 12 mm umbilical port was closed with interrupted 0 Vicryl. 2-0 Vicryl was then used to close subcutaneous tissue after was irrigated with sterile fluid. The skin was then closed with 3-0 Monocryl sutures and Dermabond.  Anesthesia was reversed. The patient was taken  to the PACU in stable condition. She will need to keep her catheter in for 2 weeks. She will followup with me in 2 weeks for cystogram prior to catheter removal. She will need 5 days of antibiotic therapy initially and then another 3 days beginning one day before her cystogram. She will be  started on anticholinergic medications for bladder spasms.  All counts were correct at the end of the case.

## 2012-12-22 NOTE — OR Nursing (Signed)
Dr. Langston Masker, Dr. Margarita Grizzle, and Dr. Brunilda Payor made aware of pt's allergy to purple dye when methylene blue requested for bladder instillation.

## 2012-12-22 NOTE — OR Nursing (Addendum)
At 1304 timeout performed.  Procedure to be performed diagnostic laparscopy, possible abdominal.  Surgical team present Dr. Margarita Grizzle, Dr. Brunilda Payor, Dr. Langston Masker, Erskine Speed CRNA, Edison Pace CRNA, Rutherford Guys RN, Debbie New RN, Lanice Schwab RN, Gaetana Michaelis CST, Darrold Span CST.  Allergies noted purple dye and latex.

## 2012-12-22 NOTE — OR Nursing (Signed)
Four port sites for third procedure.  One at umbilicus dermabond applied, 2-left lateral dermabond applied, 3-right lateral dermabond applied, 4-upper lateral medial dermabond applied.

## 2012-12-22 NOTE — Preoperative (Signed)
Beta Blockers   Reason not to administer Beta Blockers:Not Applicable 

## 2012-12-22 NOTE — OR Nursing (Signed)
Pt position assessed at 0817 and 0850.  Pt position is within normal limits and appropriate.

## 2012-12-22 NOTE — Progress Notes (Signed)
Approximately 15 minutes after I left the operating room, I received a call to return to the OR.  On my return the nurse told me that the patient awoke from anesthesia with heavy coughing.  At that time, approximately 300cc of blood was projected vaginally and frank hematuria was first noted.    On my exam, vaginally a single cuff closure stitch was noted to have popped and active bleeding was coming from the cuff.  This was replaced and the cuff was hemostatic.    The foley catheter contained frank hematuria which continued to drain.  The catheter was flushed and replaced without clearing of the urine.  The decision was made to look again with cystoscopy.    On cystoscopy, two oozing vessels were noted at urethrovesical junction.  There was otherwise no active bleeding in the bladder.  On survey of the bladder, between the ureteral orifices and closer to the right was a hole through and through the bladder.    Because of the location of the defect and the bleeding tissue at the UV junction, a urology consult was placed.  By the time urology arrived, the bleeding at the UV junction had stopped.    I assisted with the remaining procedures.  Please see Dr. Madilyn Hook and Dr. Hilario Quarry dictations for details.

## 2012-12-22 NOTE — Anesthesia Postprocedure Evaluation (Addendum)
  Anesthesia Post-op Note  Patient: Veronica Olson  Procedure(s) Performed: Procedure(s) with comments: HYSTERECTOMY TOTAL LAPAROSCOPIC (N/A) CYSTOSCOPY (N/A) CYSTOSCOPY (N/A) CYSTOSCOPY (N/A) - woodruff  repeated cysto prior to closure LAPAROSCOPY DIAGNOSTIC with bladder repair (N/A)  Patient Location: PACU  Anesthesia Type:General  Level of Consciousness: awake, alert  and oriented  Airway and Oxygen Therapy: Patient Spontanous Breathing  Post-op Pain: mild  Post-op Assessment: Post-op Vital signs reviewed, Patient's Cardiovascular Status Stable, Respiratory Function Stable, Patent Airway, No signs of Nausea or vomiting and Pain level controlled. Patient was noted to have gross hematuria after extubation first time. Patient had general anesthesia induced for further evaluation and treatment. Urology called in for repair of cystotomy.  Post-op Vital Signs: Reviewed and stable  Complications: No apparent anesthesia complications

## 2012-12-22 NOTE — OR Nursing (Signed)
At 1012 a total of of frank blood loss came out of pt's peri area.  Foley assessed dark red blood noted in foley tubing for entire length.  Dr. Langston Masker notified to return to OR room 4 to assess pt's blood loss.  Dr. Langston Masker enters OR 4 at 1020.  Dr. Langston Masker assesses pt, irrigates foley catheter with of sterile water, and states she wants to cysto pt.  At 1038 second procedure starts. Urologists called @1118  returned call at 1129 by Dr. Brunilda Payor.  Dr. Brunilda Payor  Arrives to OR 4 @ 1203.  Dr. Margarita Grizzle called at 1210 to come to OR 4 by Dr. Brunilda Payor.   Dr. Margarita Grizzle arrives at 1239.  Dr. Margarita Grizzle states he wants to proceed with Exploratory lap, possible abdominal, and cysto.  OR surgical team proceeds to set up room for procedure.  Pt's family updated at 1250 by Dr. Langston Masker

## 2012-12-22 NOTE — Progress Notes (Signed)
Per Urology, patient is on Keflex s/p bladder injury with repair.  She will be discharged home with a foley and leg bag and continue the antibiotics x 5 days.    Mitchel Honour, DO

## 2012-12-22 NOTE — Op Note (Signed)
Veronica Olson is a 47 y.o.   12/22/2012  General  Preop diagnosis: Gross hematuria, status post laparoscopic-assisted vaginal hysterectomy. Rule out bladder injury.  Postop diagnosis: Bladder laceration  Procedure done: Cystoscopy, bilateral retrograde pyelogram  Surgeon: Wendie Simmer. [name]  Assistant:  Dr. Mitchel Honour  Indication: The patient is a 47 years old female who was found to have a gross hematuria during Valsalva maneuver after completion of laparoscopic-assisted vaginal hysterectomy when she was awakened from anesthesia. Dr. Langston Masker then did cystoscopy and noted a small hole in the bladder. She then asked for assistance in further management of possible bladder laceration.  Procedure: Patient was identified by her wrist band. At that point she was under monitor anesthesia care. The cystoscope was already in the bladder. There was a transverse bladder tear above the trigone in between the ureteral orifices. It measures about 2-3 cm. There was no evidence of other bladder injury. The ureteral orifices are in normal position and shape. Clear efflux was noted from each ureteral orifice. There is a small stone in the bladder.  Left retrograde pyelogram:  A cone-tip catheter was passed through the cystoscope and the left ureteral orifice. 10 cc of Omnipaque were then injected through the cone-tip catheter. The ureter appears normal. There is no evidence of extravasation of contrast. There is no hydroureter there is a filling defect in the renal pelvis that probably represents a renal calculus. The calyces appear normal. The cone-tip catheter was removed from the left ureteral orifice.  Right retrograde pyelogram:  The cone-tip catheter was passed through the right ureteral orifice. Contrast was then injected through the cone-tip catheter the ureter appears normal. There is no extravasation of contrast and there is no hydroureter. The renal pelvis is normal. There is a filling defect in  the lower pole calyx that is probably a stone in the lower pole calyx. The cone-tip catheter was then removed.  I then discussed with Dr. Langston Masker management of the bladder injury. I told her that I do not laparoscopy procedures and the patient would need an open procedure if I proceed. I then called the office to find out if one of my laparoscopic surgeons partner was available for consultation. I discussed the case with Dr. Margarita Grizzle and he was kind enough to come to the OR to assess the situation. He felt like he could attempt to do a laparoscopic procedure. But he is unable to identify the bladder tear he would need to do an open procedure. I stayed in the operating room to assist in case he would need to proceed with a cystotomy. He was able to repair the bladder laceration laparoscopically. For specifics see his portion of the dictation. At that point I then left the operating room and Dr. Margarita Grizzle and Dr. Langston Masker completed the procedure.

## 2012-12-23 LAB — BASIC METABOLIC PANEL
Calcium: 8.6 mg/dL (ref 8.4–10.5)
GFR calc non Af Amer: >90 mL/min (ref >90)
Sodium: 139 mEq/L (ref 135–145)

## 2012-12-23 LAB — CBC
MCH: 26 pg (ref 26.0–34.0)
MCHC: 31 g/dL (ref 30.0–36.0)
Platelets: 259 10*3/uL (ref 150–400)
RDW: 14.7 % (ref 11.5–15.5)

## 2012-12-23 MED ORDER — OXYCODONE-ACETAMINOPHEN 5-325 MG PO TABS: 1.0000 | ORAL_TABLET | ORAL | Status: DC | PRN

## 2012-12-23 MED ORDER — HYOSCYAMINE SULFATE 0.125 MG PO TABS: 0.1250 mg | ORAL_TABLET | ORAL | Status: DC | PRN

## 2012-12-23 MED ORDER — DSS 100 MG PO CAPS: 100.0000 mg | ORAL_CAPSULE | Freq: Two times a day (BID) | ORAL | Status: DC

## 2012-12-23 MED ORDER — OXYBUTYNIN CHLORIDE ER 10 MG PO TB24: 10.0000 mg | ORAL_TABLET | Freq: Every day | ORAL | Status: DC

## 2012-12-23 MED ORDER — CEPHALEXIN 500 MG PO CAPS: 500.0000 mg | ORAL_CAPSULE | Freq: Two times a day (BID) | ORAL | Status: DC

## 2012-12-23 MED ORDER — IBUPROFEN 800 MG PO TABS: 800.0000 mg | ORAL_TABLET | Freq: Three times a day (TID) | ORAL | Status: DC | PRN

## 2012-12-23 NOTE — Progress Notes (Signed)
No acute events overnight.  Pain is well controlled with Motrin only.  Foley is in.  Ambulating well.  Tolerating po.  No nausea or vomiting.  Requesting to go home.  VSS. AF.  UOP > 100cc/hr Hgb 12.2->9.5  Gen: A&O x 3 Abd: soft, ND, inc c/d/i x 4 Ext: no c/c/e, SCDs on  46yo s/p TLH with cysto with cystotomy repair by urology -Continue pain control -D/C home with foley and leg bag x 2 weeks with Keflex x 5 days per urology -Advance diet

## 2012-12-23 NOTE — Anesthesia Postprocedure Evaluation (Signed)
  Anesthesia Post-op Note  Patient: Veronica Olson  Procedure(s) Performed: Procedure(s) with comments: HYSTERECTOMY TOTAL LAPAROSCOPIC (N/A) CYSTOSCOPY (N/A) CYSTOSCOPY (N/A) CYSTOSCOPY (N/A) - woodruff  repeated cysto prior to closure LAPAROSCOPY DIAGNOSTIC with bladder repair (N/A)  Patient Location: PACU and Women[facility] Unit  Anesthesia Type:General  Level of Consciousness: awake, alert  and oriented  Airway and Oxygen Therapy: Patient Spontanous Breathing  Post-op Pain: mild  Post-op Assessment: Patient's Cardiovascular Status Stable, Respiratory Function Stable, No signs of Nausea or vomiting, Adequate PO intake and Pain level controlled  Post-op Vital Signs: stable  Complications: No apparent anesthesia complications

## 2012-12-23 NOTE — Discharge Summary (Signed)
Physician Discharge Summary  Patient ID: CHANDEL ZAUN MRN: [id number] DOB/AGE: 45/19/1967 45 y.o.  Admit date: 12/22/2012 Discharge date: 12/23/2012  Admission Diagnoses: Uterine fibroids, menometrorrhagia  Discharge Diagnoses:  SAA Active Problems:   * No active hospital problems. *   Discharged Condition: good  Hospital Course: Admitted with plans to perform TLH with cystoscopy.  The procedure was performed but complicated by cystotomy.  Urology was consulted to repair the bladder.  The patient did well postoperatively and was discharged home on POD#1 as she was meeting all goals and requesting discharge.    Consults: urology  Significant Diagnostic Studies: radiology: retrograde cystourethrogram  Treatments: analgesia: acetaminophen w/ codeine and surgery: TLH, cystoscopy, repair of bladder by urology  Discharge Exam: Blood pressure 109/62, pulse 97, temperature 98.2 F (36.8 C), temperature source Oral, resp. rate 18, height 5\' 8"  (1.727 m), weight 230 lb (104.327 kg), last menstrual period 12/09/2012, SpO2 97.00%. General appearance: alert, cooperative and appears stated age GI: Nondistended and soft with incisions clean, dry, and intact Extremities: extremities normal, atraumatic, no cyanosis or edema  Disposition: 01-Home or Self Care     Medication List    TAKE these medications       cephALEXin 500 MG capsule  Commonly known as:  KEFLEX  Take 1 capsule (500 mg total) by mouth 2 (two) times daily.     DSS 100 MG Caps  Take 100 mg by mouth 2 (two) times daily.     hyoscyamine 0.125 MG tablet  Commonly known as:  LEVSIN, ANASPAZ  Take 1 tablet (0.125 mg total) by mouth every 4 (four) hours as needed.     ibuprofen 800 MG tablet  Commonly known as:  ADVIL,MOTRIN  Take 1 tablet (800 mg total) by mouth every 8 (eight) hours as needed (mild pain).     oxybutynin 10 MG 24 hr tablet  Commonly known as:  DITROPAN-XL  Take 1 tablet (10 mg total) by mouth  daily.     oxyCODONE-acetaminophen 5-325 MG per tablet  Commonly known as:  PERCOCET/ROXICET  Take 1-2 tablets by mouth every 4 (four) hours as needed.     phentermine 37.5 MG tablet  Commonly known as:  ADIPEX-P  Take 37.5 mg by mouth daily before breakfast.           Follow-up Information   Follow up with Milford Cage, MD. Call in 2 weeks.   Contact information:   [address] [place] [address] [phone]       Signed: Mitchel Honour 12/23/2012, 4:02 PM

## 2012-12-23 NOTE — Progress Notes (Signed)
Pt. Is discharged in the care of friend. Downstairs per wheelchair. Stable.  Discharge instruction with Rx were given to pt. Questions were asked and answered. Instructions were given on the use of leg bag at home. Pt tolerated all instructions well. .Foley catheter in place and draining clear amber urine. Abdominal incisions are clean and dry. Stable.

## 2012-12-23 NOTE — Progress Notes (Signed)
Urology Progress Note  Subjective:     No acute urologic events overnight. Urine has cleared of blood. Pain well controlled.  I explained the surgical findings and the course of the surgery.  ROS: Negative: CP or SOB.  Objective:  Patient Vitals for the past 24 hrs:  BP Temp Temp src Pulse Resp SpO2 Height Weight  12/23/12 0626 109/62 mmHg 98.2 F (36.8 C) Oral 97 18 97 % - -  12/23/12 0135 102/53 mmHg 98.8 F (37.1 C) Oral - 18 97 % - -  12/23/12 0005 - 99.4 F (37.4 C) Oral - - - - -  12/22/12 2207 - 99.2 F (37.3 C) Oral - - - - -  12/22/12 2139 128/73 mmHg - - 89 18 100 % - -  12/22/12 1827 113/75 mmHg 98.2 F (36.8 C) - 85 18 100 % - -  12/22/12 1730 - - - - - - 5\' 8"  (1.727 m) 104.327 kg (230 lb)  12/22/12 1725 129/73 mmHg 98.4 F (36.9 C) - 92 16 99 % - -  12/22/12 1700 120/62 mmHg 98.5 F (36.9 C) - 98 17 97 % - -  12/22/12 1645 121/66 mmHg - - 92 14 98 % - -  12/22/12 1630 123/55 mmHg - - 98 17 96 % - -  12/22/12 1615 117/74 mmHg 98.7 F (37.1 C) - 105 15 100 % - -    Physical Exam: General:  No acute distress, awake Cardiovascular:    [x]   Regular pulses, RRR  []   Irregularly irregular Chest:  CTA-B Abdomen:               []  Soft, appropriately TTP  []  Soft, NTTP  [x]  Soft, appropriately TTP, incision(s) clean/dry/intact  Genitourinary: Foley catheter in place draining clear yellow urine.     I/O last 3 completed shifts: In: 10191.5 [P.O.:1080; I.V.:5261.5; Other:3850] Out: 1700 [Urine:1050; Blood:650]  Recent Labs     12/23/12  0552  HGB  9.5*  WBC  14.5*  PLT  259    No results found for this basename: NA, K, CL, CO2, BUN, CREATININE, CALCIUM, MAGNESIUM, GFRNONAA, GFRAA,  in the last 72 hours   No results found for this basename: PT, INR, APTT,  in the last 72 hours   No components found with this basename: ABG,     Length of stay: 1 days.  Assessment: Cystotomy with hysterectomy. POD#1 Laparoscopic  cystorrhaphy.   Plan: -Continue foley catheter for 2 weeks. F/u in my office for cystogram and cath removal. I will schedule this with the patient.  -Keflex 500mg  TID x5 days now; then 500mg  TID for 3 days beginning the day before cath removal.  -Oxybutynin XL 10 mg PO daily and hyoscyamine 0.125 mg PO Q4hr prn bladder spasms.  -OK for discharge from GU point of view.   Natalia Leatherwood, MD [phone]

## 2012-12-26 ENCOUNTER — Encounter (HOSPITAL_COMMUNITY): Payer: Self-pay | Admitting: Obstetrics & Gynecology

## 2013-05-08 ENCOUNTER — Other Ambulatory Visit: Payer: Self-pay

## 2013-06-13 ENCOUNTER — Other Ambulatory Visit: Payer: Self-pay

## 2013-06-13 DIAGNOSIS — Z1231 Encounter for screening mammogram for malignant neoplasm of breast: Secondary | ICD-10-CM

## 2013-06-15 ENCOUNTER — Other Ambulatory Visit: Payer: Self-pay

## 2013-07-05 ENCOUNTER — Ambulatory Visit
Admission: RE | Admit: 2013-07-05 | Discharge: 2013-07-05 | Disposition: A | Discharge status: Home / Self Care | Payer: PRIVATE HEALTH INSURANCE | Source: Ambulatory Visit

## 2013-07-05 DIAGNOSIS — Z1231 Encounter for screening mammogram for malignant neoplasm of breast: Secondary | ICD-10-CM

## 2014-05-17 ENCOUNTER — Other Ambulatory Visit: Payer: Self-pay | Admitting: Dermatology

## 2014-06-27 ENCOUNTER — Other Ambulatory Visit: Payer: Self-pay

## 2014-06-27 DIAGNOSIS — Z1231 Encounter for screening mammogram for malignant neoplasm of breast: Secondary | ICD-10-CM

## 2014-07-27 ENCOUNTER — Ambulatory Visit: Payer: PRIVATE HEALTH INSURANCE

## 2014-07-30 ENCOUNTER — Ambulatory Visit
Admission: RE | Admit: 2014-07-30 | Discharge: 2014-07-30 | Disposition: A | Discharge status: Home / Self Care | Payer: PRIVATE HEALTH INSURANCE | Source: Ambulatory Visit

## 2014-07-30 DIAGNOSIS — Z1231 Encounter for screening mammogram for malignant neoplasm of breast: Secondary | ICD-10-CM

## 2016-01-28 ENCOUNTER — Other Ambulatory Visit: Payer: Self-pay | Admitting: Family Medicine

## 2016-01-28 DIAGNOSIS — Z1231 Encounter for screening mammogram for malignant neoplasm of breast: Secondary | ICD-10-CM

## 2016-02-07 ENCOUNTER — Other Ambulatory Visit: Payer: Self-pay | Admitting: Family Medicine

## 2016-02-07 DIAGNOSIS — Z86018 Personal history of other benign neoplasm: Secondary | ICD-10-CM

## 2016-02-07 DIAGNOSIS — R1011 Right upper quadrant pain: Secondary | ICD-10-CM

## 2016-02-07 DIAGNOSIS — Z87898 Personal history of other specified conditions: Secondary | ICD-10-CM

## 2016-02-13 ENCOUNTER — Other Ambulatory Visit: Payer: Self-pay | Admitting: Physician Assistant

## 2016-02-13 ENCOUNTER — Ambulatory Visit
Admission: RE | Admit: 2016-02-13 | Discharge: 2016-02-13 | Disposition: A | Discharge status: Home / Self Care | Payer: 59 | Source: Ambulatory Visit | Attending: Family Medicine | Admitting: Family Medicine

## 2016-02-13 DIAGNOSIS — Z1231 Encounter for screening mammogram for malignant neoplasm of breast: Secondary | ICD-10-CM

## 2016-02-17 ENCOUNTER — Ambulatory Visit
Admission: RE | Admit: 2016-02-17 | Discharge: 2016-02-17 | Disposition: A | Discharge status: Home / Self Care | Payer: 59 | Source: Ambulatory Visit | Attending: Family Medicine | Admitting: Family Medicine

## 2016-02-17 DIAGNOSIS — Z86018 Personal history of other benign neoplasm: Secondary | ICD-10-CM

## 2016-02-17 DIAGNOSIS — Z87898 Personal history of other specified conditions: Secondary | ICD-10-CM

## 2016-02-17 DIAGNOSIS — R1011 Right upper quadrant pain: Secondary | ICD-10-CM

## 2017-09-27 ENCOUNTER — Other Ambulatory Visit: Payer: Self-pay | Admitting: Physician Assistant

## 2017-09-27 DIAGNOSIS — Z1231 Encounter for screening mammogram for malignant neoplasm of breast: Secondary | ICD-10-CM

## 2017-10-01 ENCOUNTER — Ambulatory Visit
Admission: RE | Admit: 2017-10-01 | Discharge: 2017-10-01 | Disposition: A | Discharge status: Home / Self Care | Payer: BLUE CROSS/BLUE SHIELD | Source: Ambulatory Visit | Attending: Physician Assistant | Admitting: Physician Assistant

## 2017-10-01 DIAGNOSIS — Z1231 Encounter for screening mammogram for malignant neoplasm of breast: Secondary | ICD-10-CM

## 2019-11-01 NOTE — Progress Notes (Signed)
Cardiology Office Note:   Date:  11/02/2019  NAME:  Veronica Olson    MRN: [id number] DOB:  May 27, 1966   PCP:  Elton Sin, FNP  Cardiologist:  No primary care provider on file.   Referring MD: Elton Sin, FNP   Chief Complaint  Patient presents with  . New Patient (Initial Visit)    ABN EKG.  . Palpitations    History of Present Illness:   Veronica Olson is a 54 y.o. female with a hx of obesity who is being seen today for the evaluation of palpitations/abnormal EKG at the request of Elton Sin, FNP.  She presents for evaluations of palpitations as well as dizziness.  She reports on Monday she had an episode where she felt her heart racing.  She became dizzy and short of breath.  The episodes would continue to occur lasting 1 to 2 minutes.  She reports they would just start up out of the blue without any identifiable trigger.  She reports it would just get better but she did feel that she was in a pass out.  She did not have a syncopal event.  She denies any chest pain or tightness with it.  She is recently started a new diet which is mainly low carbohydrate and low sugar.  She reports she is recently lost 25 pounds.  She was evaluated in the urgent care by [facility] physicians and they noted possible EKG abnormality.  They then referred her here to our clinic for evaluation.  There were mention of Q waves on EKG there.  There were no EKG changes like that today.  She has apparently had the symptoms for years.  She is always had palpitations that have been attributed to anxiety.  She reports this episode was a bit different as it was more prolonged and more pronounced.  She is staying well-hydrated and plans to switch her diet.  She does report that she does not have health insurance and would not like to do any unnecessary testing.  They did check her thyroid studies at the urgent care which showed a TSH of 1.65.  Her serum creatinine was 0.90.  She did have marginally elevated  liver enzymes and they have plans to work this up further.  Past Medical History: Past Medical History:  Diagnosis Date  . Medical history non-contributory     Past Surgical History: Past Surgical History:  Procedure Laterality Date  . CYSTOSCOPY N/A 12/22/2012   Procedure: CYSTOSCOPY;  Surgeon: Linda Hedges, DO;  Location: [facility];  Service: Gynecology;  Laterality: N/A;  . CYSTOSCOPY N/A 12/22/2012   Procedure: CYSTOSCOPY;  Surgeon: Linda Hedges, DO;  Location: [facility];  Service: Gynecology;  Laterality: N/A;  . CYSTOSCOPY N/A 12/22/2012   Procedure: CYSTOSCOPY;  Surgeon: Molli Hazard, MD;  Location: [facility];  Service: Urology;  Laterality: N/A;  woodruff  repeated cysto prior to closure  . LAPAROSCOPIC HYSTERECTOMY N/A 12/22/2012   Procedure: HYSTERECTOMY TOTAL LAPAROSCOPIC;  Surgeon: Linda Hedges, DO;  Location: [facility];  Service: Gynecology;  Laterality: N/A;  . LAPAROSCOPY N/A 12/22/2012   Procedure: LAPAROSCOPY DIAGNOSTIC with bladder repair;  Surgeon: Molli Hazard, MD;  Location: [facility];  Service: Urology;  Laterality: N/A;  . NO PAST SURGERIES    . WISDOM TOOTH EXTRACTION      Current Medications: Current Meds  Medication Sig  . MAGNESIUM PO Take 1 tablet by mouth daily.  Marland Kitchen MELATONIN PO Take 1 tablet by mouth  at bedtime.  . [DISCONTINUED] Magnesium Phosphate POWD by Does not apply route.     Allergies:    Other, Wellbutrin [bupropion], and Latex   Social History: Social History   Socioeconomic History  . Marital status: Married    Spouse name: Not on file  . Number of children: Not on file  . Years of education: Not on file  . Highest education level: Not on file  Occupational History  . Not on file  Tobacco Use  . Smoking status: Former Smoker    Packs/day: 1.50    Years: 7.00    Pack years: 10.50    Types: Cigarettes    Quit date: 08/11/1991    Years since quitting: 28.2  . Smokeless tobacco: Never Used  Substance and Sexual Activity  .  Alcohol use: Yes    Comment: occasionally  . Drug use: No  . Sexual activity: Yes    Birth control/protection: None  Other Topics Concern  . Not on file  Social History Narrative  . Not on file   Social Determinants of Health   Financial Resource Strain:   . Difficulty of Paying Living Expenses:   Food Insecurity:   . Worried About Charity fundraiser in the Last Year:   . Arboriculturist in the Last Year:   Transportation Needs:   . Film/video editor (Medical):   Marland Kitchen Lack of Transportation (Non-Medical):   Physical Activity:   . Days of Exercise per Week:   . Minutes of Exercise per Session:   Stress:   . Feeling of Stress :   Social Connections:   . Frequency of Communication with Friends and Family:   . Frequency of Social Gatherings with Friends and Family:   . Attends Religious Services:   . Active Member of Clubs or Organizations:   . Attends Archivist Meetings:   Marland Kitchen Marital Status:      Family History: The patient's family history includes Prostate cancer in her father.  ROS:   All other ROS reviewed and negative. Pertinent positives noted in the HPI.     EKGs/Labs/Other Studies Reviewed:   The following studies were personally reviewed by me today:  EKG:  EKG is ordered today.  The ekg ordered today demonstrates normal sinus rhythm, heart rate 94, no acute ST-T changes, no evidence of prior infarction, nonspecific ST-T changes and was personally reviewed by me.   Recent Labs: No results found for requested labs within last 8760 hours.   Recent Lipid Panel No results found for: CHOL, TRIG, HDL, CHOLHDL, VLDL, LDLCALC, LDLDIRECT  Physical Exam:   VS:  BP (!) 146/82 (BP Location: Left Arm, Patient Position: Sitting, Cuff Size: Large)   Pulse 94   Ht 5\' 7"  (1.702 m)   Wt 225 lb (102.1 kg)   LMP 12/09/2012   BMI 35.24 kg/m    Wt Readings from Last 3 Encounters:  11/02/19 225 lb (102.1 kg)  12/22/12 230 lb (104.3 kg)  11/30/12 228 lb (103.4  kg)    General: Well nourished, well developed, in no acute distress Heart: Atraumatic, normal size  Eyes: PEERLA, EOMI  Neck: Supple, no JVD Endocrine: No thryomegaly Cardiac: Normal S1, S2; RRR; no murmurs, rubs, or gallops Lungs: Clear to auscultation bilaterally, no wheezing, rhonchi or rales  Abd: Soft, nontender, no hepatomegaly  Ext: No edema, pulses 2+ Musculoskeletal: No deformities, BUE and BLE strength normal and equal Skin: Warm and dry, no rashes   Neuro:  Alert and oriented to person, place, time, and situation, CNII-XII grossly intact, no focal deficits  Psych: Normal mood and affect   ASSESSMENT:   Veronica Olson is a 54 y.o. female who presents for the following: 1. Palpitations   2. Nonspecific abnormal electrocardiogram (ECG) (EKG)     PLAN:   1. Palpitations 2. Nonspecific abnormal electrocardiogram (ECG) (EKG) -Normal thyroid studies.  Her EKG today demonstrates nonspecific ST-T changes likely obesity related.  I see no evidence of Q waves or evidence of prior infarction.  Her cardiovascular examination is very benign.  I did recommend an echocardiogram as well as a heart monitor to exclude arrhythmia and to exclude structural heart disease, but she is unable to proceed with this due to lack of health insurance.  I do understand that.  For now we will just continue with conservative management.  She will remain well-hydrated as well as refrain from any crash diets that she has been doing.  We will see her back on an as-needed basis.   Disposition: Return if symptoms worsen or fail to improve.  Medication Adjustments/Labs and Tests Ordered: Current medicines are reviewed at length with the patient today.  Concerns regarding medicines are outlined above.  Orders Placed This Encounter  Procedures  . EKG 12-Lead   No orders of the defined types were placed in this encounter.   Patient Instructions  Medication Instructions:  The current medical regimen is  effective;  continue present plan and medications.  *If you need a refill on your cardiac medications before your next appointment, please call your pharmacy*   Follow-Up: At [facility], you and your health needs are our priority.  As part of our continuing mission to provide you with exceptional heart care, we have created designated Provider Care Teams.  These Care Teams include your primary Cardiologist (physician) and Advanced Practice Providers (APPs -  Physician Assistants and Nurse Practitioners) who all work together to provide you with the care you need, when you need it.  We recommend signing up for the patient portal called "MyChart".  Sign up information is provided on this After Visit Summary.  MyChart is used to connect with patients for Virtual Visits (Telemedicine).  Patients are able to view lab/test results, encounter notes, upcoming appointments, etc.  Non-urgent messages can be sent to your provider as well.   To learn more about what you can do with MyChart, go to [email or web address].    Your next appointment:   As needed  The format for your next appointment:   Either In Person or Virtual  Provider:   Eleonore Chiquito, MD     Signed, Addison Naegeli. Audie Box, [place]  [address], [place], [place] [address] [phone]  11/02/2019 4:52 PM

## 2019-11-02 ENCOUNTER — Ambulatory Visit (INDEPENDENT_AMBULATORY_CARE_PROVIDER_SITE_OTHER): Payer: Self-pay | Admitting: Cardiovascular Disease

## 2019-11-02 ENCOUNTER — Encounter: Payer: Self-pay | Admitting: Cardiovascular Disease

## 2019-11-02 ENCOUNTER — Other Ambulatory Visit: Payer: Self-pay

## 2019-11-02 VITALS — BP 146/82 | HR 94 | Ht 67.0 in | Wt 225.0 lb

## 2019-11-02 DIAGNOSIS — R9431 Abnormal electrocardiogram [ECG] [EKG]: Secondary | ICD-10-CM

## 2019-11-02 DIAGNOSIS — R002 Palpitations: Secondary | ICD-10-CM

## 2019-11-02 NOTE — Patient Instructions (Signed)
Medication Instructions:  The current medical regimen is effective;  continue present plan and medications.  *If you need a refill on your cardiac medications before your next appointment, please call your pharmacy*   Follow-Up: At [facility], you and your health needs are our priority.  As part of our continuing mission to provide you with exceptional heart care, we have created designated Provider Care Teams.  These Care Teams include your primary Cardiologist (physician) and Advanced Practice Providers (APPs -  Physician Assistants and Nurse Practitioners) who all work together to provide you with the care you need, when you need it.  We recommend signing up for the patient portal called "MyChart".  Sign up information is provided on this After Visit Summary.  MyChart is used to connect with patients for Virtual Visits (Telemedicine).  Patients are able to view lab/test results, encounter notes, upcoming appointments, etc.  Non-urgent messages can be sent to your provider as well.   To learn more about what you can do with MyChart, go to [email or web address].    Your next appointment:   As needed  The format for your next appointment:   Either In Person or Virtual  Provider:   [place] O'Neal, MD      

## 2019-11-21 ENCOUNTER — Ambulatory Visit: Payer: BLUE CROSS/BLUE SHIELD | Admitting: Cardiology

## 2019-11-23 ENCOUNTER — Ambulatory Visit: Payer: Self-pay | Attending: Internal Medicine

## 2019-11-23 DIAGNOSIS — Z23 Encounter for immunization: Secondary | ICD-10-CM

## 2019-11-23 NOTE — Progress Notes (Signed)
   Covid-19 Vaccination Clinic  Name:  Veronica Olson    MRN: [id number] DOB: Jun 05, 1966  11/23/2019  [name] was observed post Covid-19 immunization for 15 minutes without incident. She was provided with Vaccine Information Sheet and instruction to access the V-Safe system.   [name] was instructed to call 911 with any severe reactions post vaccine: Marland Kitchen Difficulty breathing  . Swelling of face and throat  . A fast heartbeat  . A bad rash all over body  . Dizziness and weakness   Immunizations Administered    Name Date Dose VIS Date Route   Pfizer COVID-19 Vaccine 11/23/2019 12:18 PM 0.3 mL 07/21/2019 Intramuscular   Manufacturer: [place]   Lot: [id number]   [place]: [id number]

## 2019-12-25 ENCOUNTER — Ambulatory Visit: Payer: Self-pay

## 2019-12-27 ENCOUNTER — Ambulatory Visit: Payer: Self-pay

## 2019-12-28 ENCOUNTER — Ambulatory Visit: Payer: Self-pay | Attending: Internal Medicine

## 2019-12-28 DIAGNOSIS — Z23 Encounter for immunization: Secondary | ICD-10-CM

## 2019-12-28 NOTE — Progress Notes (Signed)
   Covid-19 Vaccination Clinic  Name:  SUMIYA BELFER    MRN: [id number] DOB: 1966-03-09  12/28/2019  Ms. Reisler was observed post Covid-19 immunization for 15 minutes without incident. She was provided with Vaccine Information Sheet and instruction to access the V-Safe system.   Ms. Beranek was instructed to call 911 with any severe reactions post vaccine: Marland Kitchen Difficulty breathing  . Swelling of face and throat  . A fast heartbeat  . A bad rash all over body  . Dizziness and weakness   Immunizations Administered    Name Date Dose VIS Date Route   Pfizer COVID-19 Vaccine 12/28/2019 11:56 AM 0.3 mL 10/04/2018 Intramuscular   Manufacturer: [employer], [employer]   Lot: [id number]   [place]: [id number]

## 2020-01-15 IMAGING — MG DIGITAL SCREENING BILATERAL MAMMOGRAM WITH TOMO AND CAD
8 series · 8 of 24 positions shown · non-contrast
Comparison: Previous exam(s).

CLINICAL DATA: Screening.

EXAM:
DIGITAL SCREENING BILATERAL MAMMOGRAM WITH TOMO AND CAD

[L MLO synth-2D]
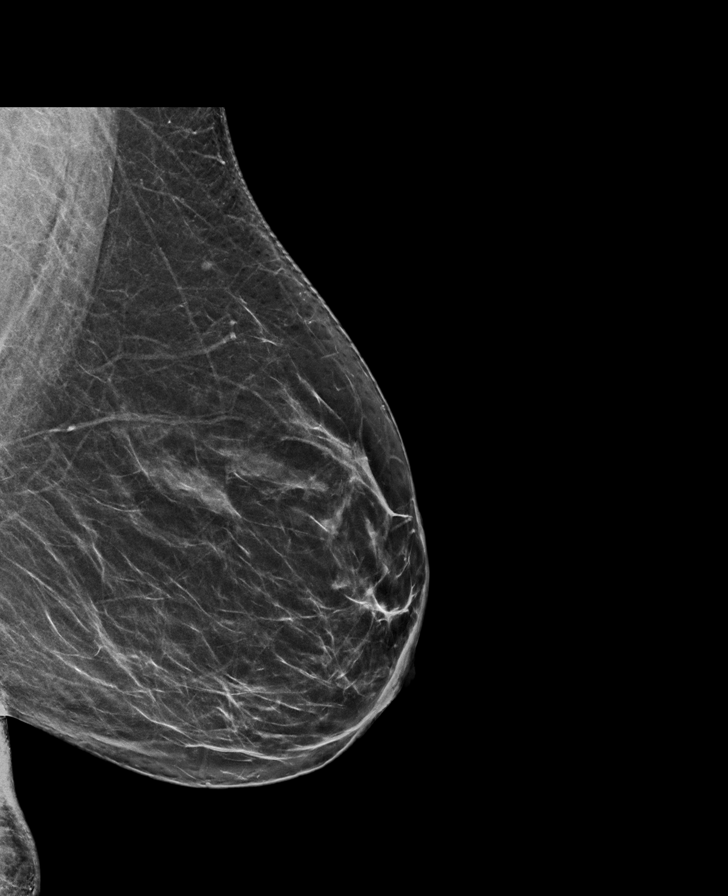

[R CC synth-2D]
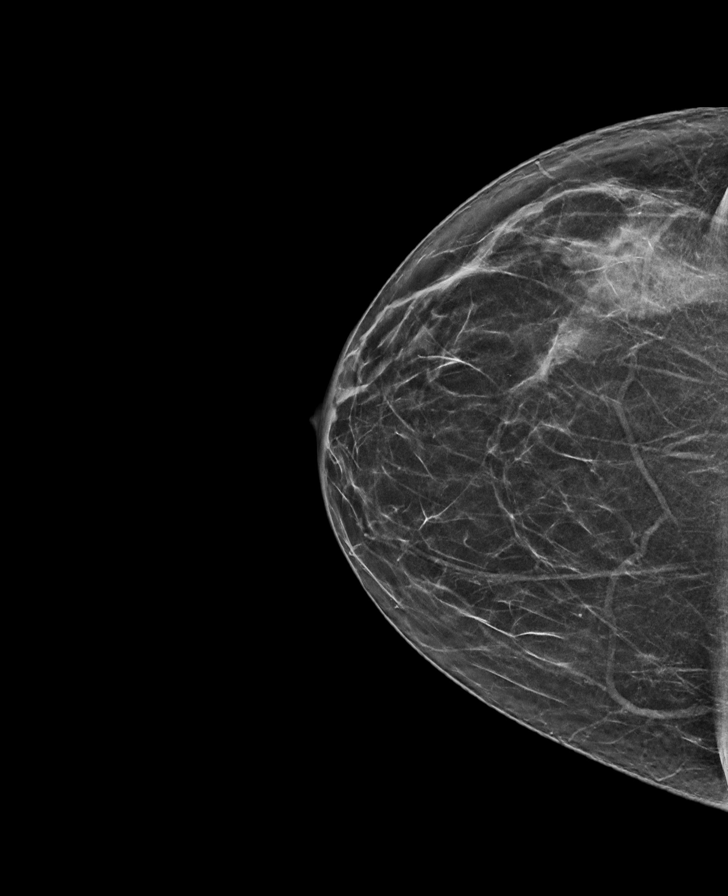

[L CC synth-2D]
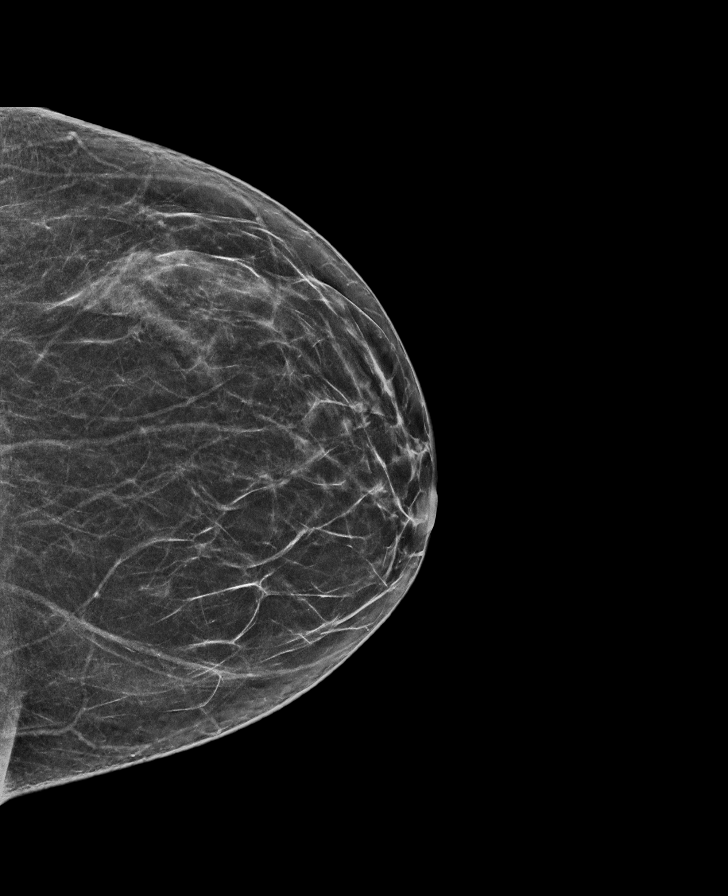

[R MLO synth-2D]
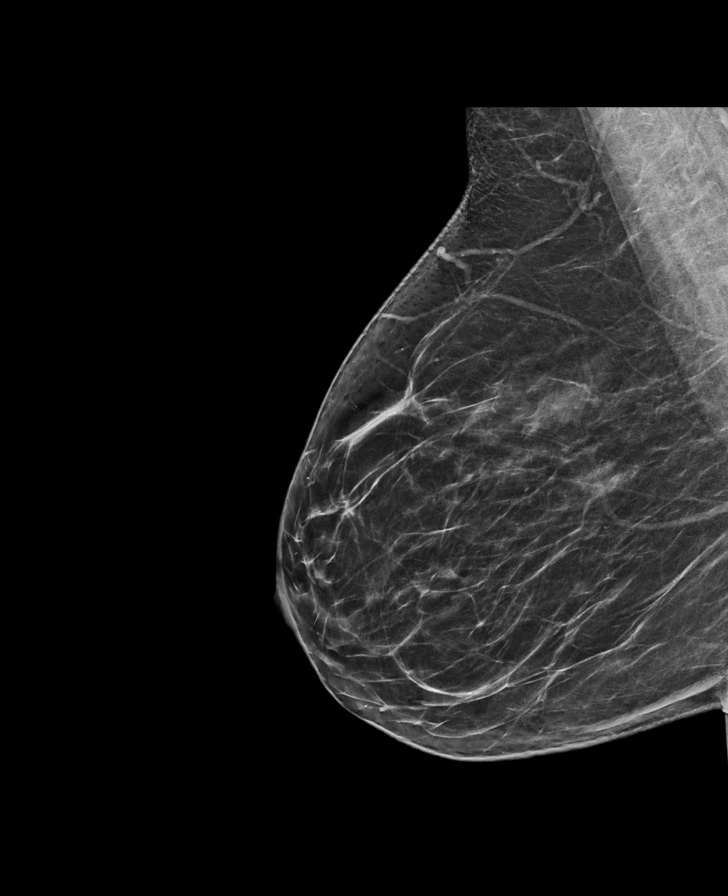

[R CC tomo · tomo slice 33/65.0]
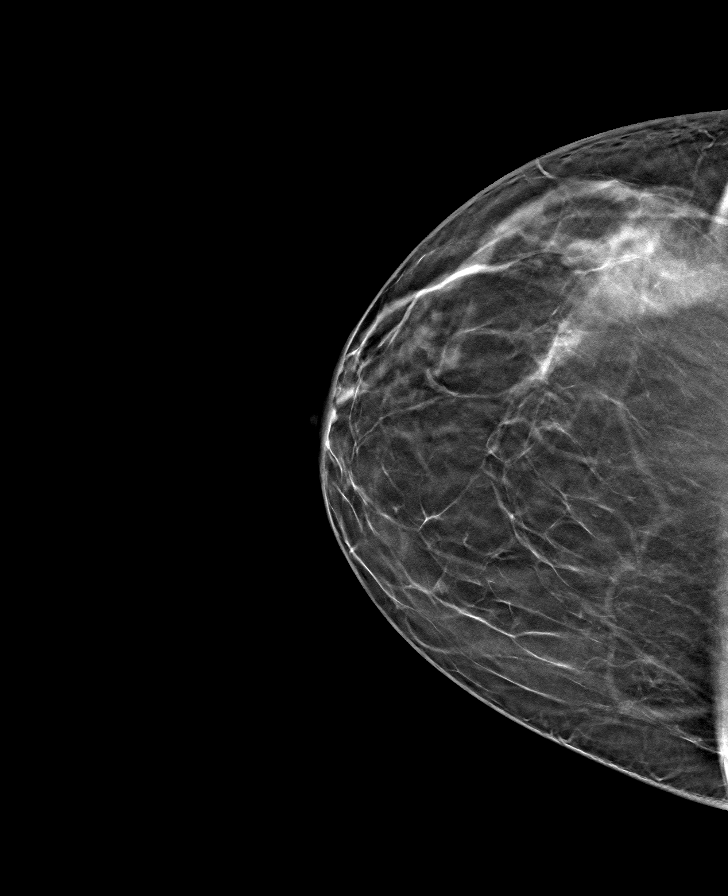

[R MLO tomo · tomo slice 37/72.0]
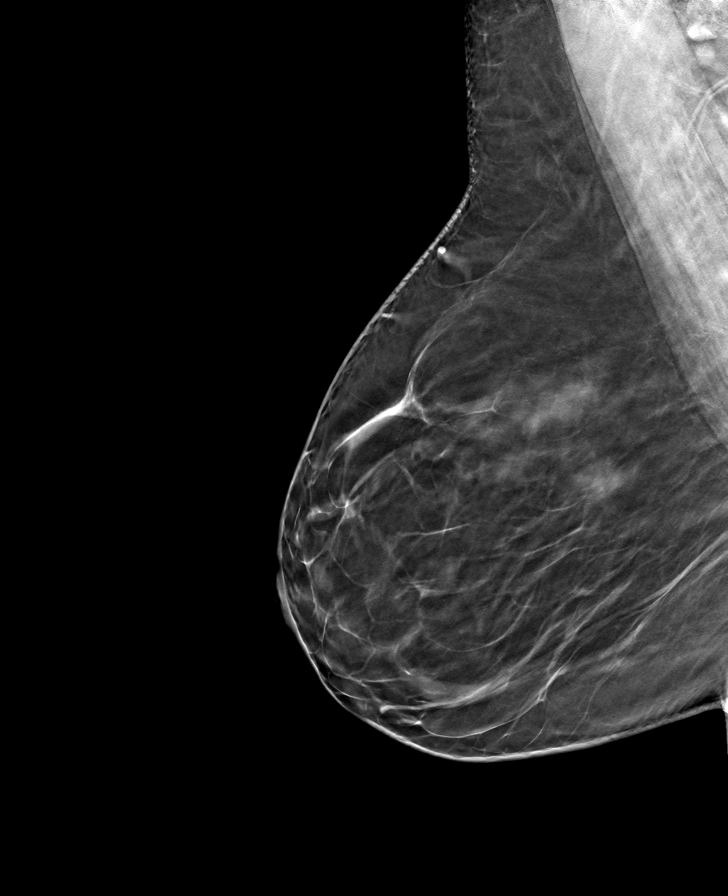

[L MLO tomo · tomo slice 37/72.0]
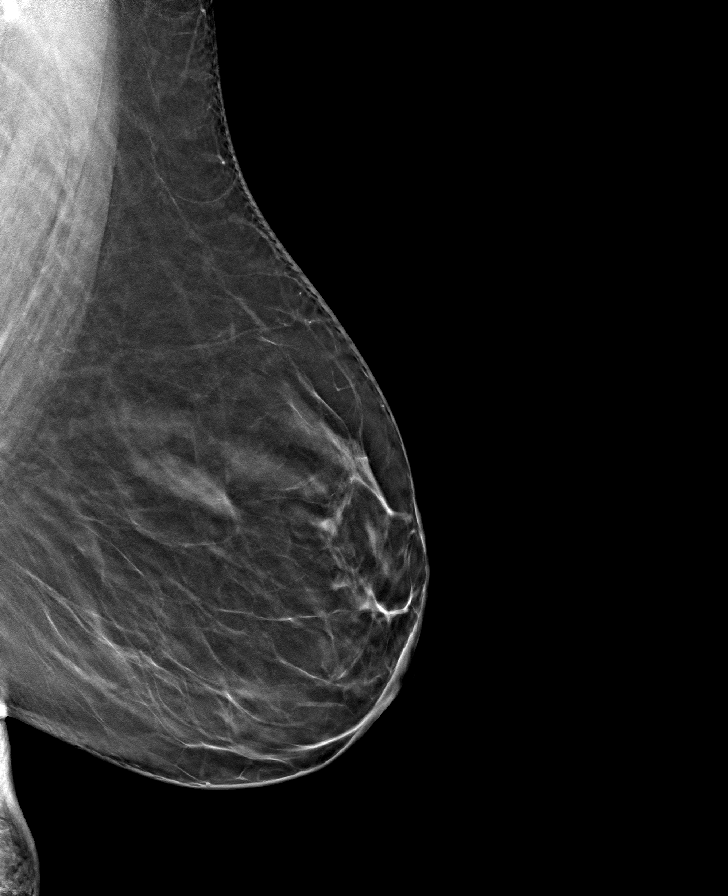

[L CC tomo · tomo slice 32/63.0]
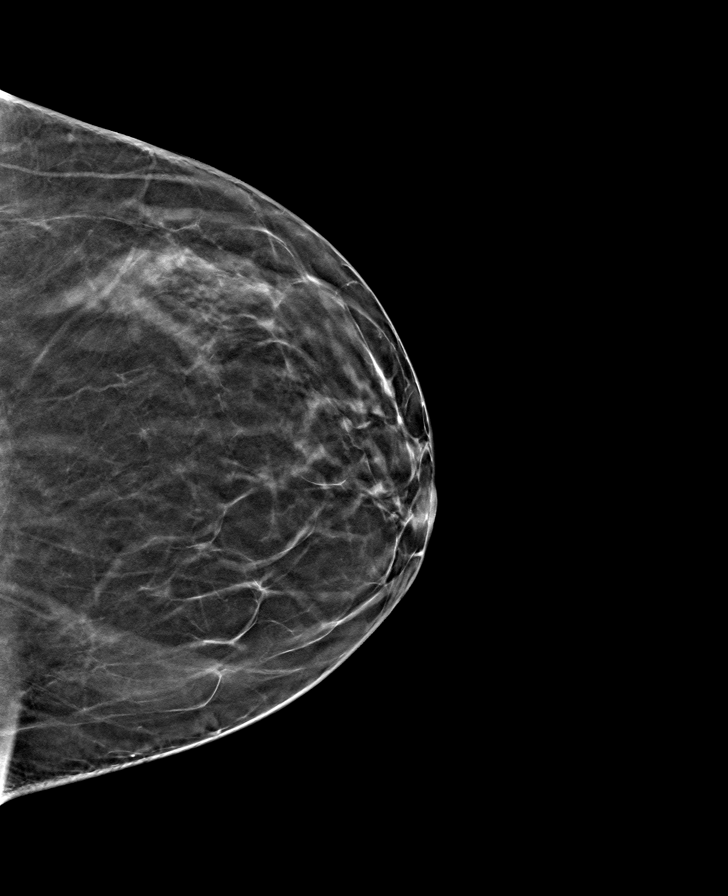

[8 of 24 positions shown; findings below may reference images not displayed]

ACR Breast Density Category b: There are scattered areas of
fibroglandular density.
FINDINGS: There are no findings suspicious for malignancy. Images were
processed with CAD.
IMPRESSION: No mammographic evidence of malignancy. A result letter of this
screening mammogram will be mailed directly to the patient.

RECOMMENDATION:
Screening mammogram in one year. (Code:CN-U-775)

BI-RADS CATEGORY  1: Negative.

## 2020-10-04 ENCOUNTER — Other Ambulatory Visit: Payer: Self-pay | Admitting: Family

## 2020-10-04 DIAGNOSIS — R1011 Right upper quadrant pain: Secondary | ICD-10-CM

## 2020-10-09 ENCOUNTER — Inpatient Hospital Stay: Admission: RE | Admit: 2020-10-09 | Payer: 59 | Source: Ambulatory Visit
